# Patient Record
Sex: Male | Born: 1995 | Race: White | Hispanic: No | Marital: Single | Smoking: Current every day smoker
Health system: Southern US, Community
[De-identification: ages and names within clinical notes are randomized; demographics above are authoritative.]

## PROBLEM LIST (undated history)

## (undated) DIAGNOSIS — J189 Pneumonia, unspecified organism: Secondary | ICD-10-CM

---
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
## 2015-07-06 ENCOUNTER — Encounter (HOSPITAL_COMMUNITY): Payer: Self-pay | Admitting: Emergency Medicine

## 2015-07-06 ENCOUNTER — Emergency Department (HOSPITAL_COMMUNITY)
Admission: EM | Admit: 2015-07-06 | Discharge: 2015-07-06 | Disposition: A | Discharge status: Home / Self Care | Payer: Self-pay | Attending: Emergency Medicine | Admitting: Emergency Medicine

## 2015-07-06 DIAGNOSIS — N390 Urinary tract infection, site not specified: Secondary | ICD-10-CM | POA: Insufficient documentation

## 2015-07-06 DIAGNOSIS — F17210 Nicotine dependence, cigarettes, uncomplicated: Secondary | ICD-10-CM | POA: Insufficient documentation

## 2015-07-06 LAB — URINE MICROSCOPIC-ADD ON

## 2015-07-06 LAB — URINALYSIS, ROUTINE W REFLEX MICROSCOPIC
Bilirubin Urine: NEGATIVE
Glucose, UA: NEGATIVE mg/dL
KETONES UR: NEGATIVE mg/dL
NITRITE: POSITIVE — AB
PROTEIN: 100 mg/dL — AB
Specific Gravity, Urine: 1.025 (ref 1.005–1.030)
pH: 6 (ref 5.0–8.0)

## 2015-07-06 MED ORDER — CEPHALEXIN 500 MG PO CAPS: 500.0000 mg | ORAL_CAPSULE | Freq: Four times a day (QID) | ORAL | Status: DC

## 2015-07-06 MED ORDER — CEPHALEXIN 500 MG PO CAPS
500.0000 mg | ORAL_CAPSULE | Freq: Once | ORAL | Status: AC
  Administered 2015-07-06: 500 mg via ORAL
  Filled 2015-07-06: qty 1

## 2015-07-06 NOTE — Discharge Instructions (Signed)

## 2015-07-06 NOTE — ED Notes (Signed)
Pt reports burning with urination x 1 week with lower abd pain. Pt reports negative STD check about 1 mo ago with no new exposures since. Pt denies fevers.

## 2015-07-06 NOTE — ED Provider Notes (Signed)
CSN: [id number]     Arrival date & time 07/06/15  1539 History   First MD Initiated Contact with Patient 07/06/15 1611     Chief Complaint  Patient presents with  . Dysuria     (Consider location/radiation/quality/duration/timing/severity/associated sxs/prior Treatment) HPI   James Wang is a 19 y.o. male who presents to the Emergency Department complaining of burning with urination and lower abdominal pain for one week.  He states that he was diagnosed with a urinary tract infection 4-5 months ago and present symptoms feel similar. He states the symptoms have been becoming more persistent. He states that he only has one sexual partner currently and was checked for STD's one month ago.  Nothing makes the symptoms better or worse.  He denies fever, n/v, penile discharge, hematuria, testicle pain or swelling.    History reviewed. No pertinent past medical history. History reviewed. No pertinent past surgical history. No family history on file. Social History  Substance Use Topics  . Smoking status: Light Tobacco Smoker    Types: Cigarettes  . Smokeless tobacco: None  . Alcohol Use: No    Review of Systems  Constitutional: Negative for fever, chills, activity change and appetite change.  Respiratory: Negative for chest tightness and shortness of breath.   Gastrointestinal: Negative for nausea, vomiting and abdominal pain.  Genitourinary: Positive for dysuria, urgency and frequency. Negative for hematuria, flank pain, decreased urine volume, discharge, penile swelling, scrotal swelling, difficulty urinating, genital sores and testicular pain.  Musculoskeletal: Negative for back pain.  Skin: Negative for rash.  Neurological: Negative for dizziness, weakness and numbness.  Hematological: Negative for adenopathy.  Psychiatric/Behavioral: Negative for confusion.  All other systems reviewed and are negative.     Allergies  Review of patient's allergies indicates no known  allergies.  Home Medications   Prior to Admission medications   Not on File   Pulse 94  Temp(Src) 98.8 F (37.1 C) (Oral)  Resp 18  Ht 6\' 4"  (1.93 m)  Wt 64.864 kg  BMI 17.41 kg/m2  SpO2 100% Physical Exam  Constitutional: He is oriented to person, place, and time. He appears well-developed and well-nourished. No distress.  HENT:  Head: Atraumatic.  Mouth/Throat: Oropharynx is clear and moist.  Cardiovascular: Normal rate, regular rhythm and intact distal pulses.   No murmur heard. Pulmonary/Chest: Effort normal and breath sounds normal. No respiratory distress. He has no wheezes. He has no rales.  Abdominal: Soft. Normal appearance. He exhibits no distension and no mass. There is no hepatosplenomegaly. There is tenderness in the suprapubic area. There is no rigidity, no rebound, no guarding, no CVA tenderness and no tenderness at McBurney's point. Hernia confirmed negative in the right inguinal area and confirmed negative in the left inguinal area.  Mild ttp of the suprapubic region.  Remaining abdomen is soft, non-tender without guarding or rebound tenderness. No CVA tenderness  Genitourinary: Testes normal and penis normal. Cremasteric reflex is present. Right testis shows no mass and no tenderness. Right testis is descended. Left testis shows no mass and no tenderness. Left testis is descended. Circumcised. No penile tenderness. No discharge found.  Exam was chaperoned by nursing staff  Musculoskeletal: Normal range of motion. He exhibits no edema.  Neurological: He is alert and oriented to person, place, and time. Coordination normal.  Skin: Skin is warm and dry. No rash noted.  Nursing note and vitals reviewed.   ED Course  Procedures (including critical care time) Labs Review Labs Reviewed  URINALYSIS, ROUTINE  W REFLEX MICROSCOPIC (NOT AT [facility]) - Abnormal; Notable for the following:    APPearance HAZY (*)    Hgb urine dipstick LARGE (*)    Protein, ur 100 (*)     Nitrite POSITIVE (*)    Leukocytes, UA MODERATE (*)    All other components within normal limits  URINE MICROSCOPIC-ADD ON - Abnormal; Notable for the following:    Squamous Epithelial / LPF 0-5 (*)    Bacteria, UA MANY (*)    All other components within normal limits  URINE CULTURE    I have personally reviewed and evaluated these images and lab results as part of my medical decision-making.  Urine culture, Gc and chlamydia pending  MDM   Final diagnoses:  Acute urinary tract infection   Pt is well appearing.  Non-toxic.  Vitals stable.  Will treat for infection and advised pt to arrange urology f/u given that this appears to be a recurrent issue.  He verb understanding and agrees to plan.  Stable for d/c     Pauline Aus, PA-C 07/08/15 2319  Donnetta Hutching, MD 07/10/15 1336

## 2015-07-07 LAB — GC/CHLAMYDIA PROBE AMP (~~LOC~~) NOT AT ARMC
CHLAMYDIA, DNA PROBE: NEGATIVE
NEISSERIA GONORRHEA: NEGATIVE

## 2015-07-08 LAB — URINE CULTURE: Culture: >=100000

## 2015-07-09 ENCOUNTER — Telehealth (HOSPITAL_BASED_OUTPATIENT_CLINIC_OR_DEPARTMENT_OTHER): Payer: Self-pay | Admitting: Emergency Medicine

## 2015-07-09 NOTE — Telephone Encounter (Signed)
Post ED Visit - Positive Culture Follow-up  Culture report reviewed by antimicrobial stewardship pharmacist:  []  Enzo BiNathan Batchelder, Pharm.D. []  Celedonio MiyamotoJeremy Frens, Pharm.D., BCPS [x]  Garvin FilaMike Maccia, Pharm.D. []  Georgina PillionElizabeth Martin, Pharm.D., BCPS []  CoburgMinh Pham, [address]Pharm.D., BCPS, AAHIVP []  Estella HuskMichelle Turner, Pharm.D., BCPS, AAHIVP []  Tennis Mustassie Stewart, Pharm.D. []  Sherle Poeob Vincent, [address]Pharm[address]D.  Positive urine culture E. coli Treated with cephalexin, organism sensitive to the same and no further patient follow-up is required at this time.  Berle MullMiller, Mang [place]elrigg 07/09/2015, 9:27 AM

## 2015-10-15 ENCOUNTER — Emergency Department (HOSPITAL_COMMUNITY): Payer: Self-pay

## 2015-10-15 ENCOUNTER — Other Ambulatory Visit: Payer: Self-pay

## 2015-10-15 ENCOUNTER — Emergency Department (HOSPITAL_COMMUNITY)
Admission: EM | Admit: 2015-10-15 | Discharge: 2015-10-15 | Disposition: A | Discharge status: Home / Self Care | Payer: Self-pay | Attending: Emergency Medicine | Admitting: Emergency Medicine

## 2015-10-15 ENCOUNTER — Encounter (HOSPITAL_COMMUNITY): Payer: Self-pay | Admitting: Family Medicine

## 2015-10-15 DIAGNOSIS — R42 Dizziness and giddiness: Secondary | ICD-10-CM | POA: Insufficient documentation

## 2015-10-15 DIAGNOSIS — J189 Pneumonia, unspecified organism: Secondary | ICD-10-CM

## 2015-10-15 DIAGNOSIS — D72829 Elevated white blood cell count, unspecified: Secondary | ICD-10-CM | POA: Insufficient documentation

## 2015-10-15 DIAGNOSIS — J159 Unspecified bacterial pneumonia: Secondary | ICD-10-CM | POA: Insufficient documentation

## 2015-10-15 DIAGNOSIS — R55 Syncope and collapse: Secondary | ICD-10-CM | POA: Insufficient documentation

## 2015-10-15 DIAGNOSIS — Z87891 Personal history of nicotine dependence: Secondary | ICD-10-CM | POA: Insufficient documentation

## 2015-10-15 LAB — BASIC METABOLIC PANEL
Anion gap: 12 (ref 5–15)
BUN: 8 mg/dL (ref 6–20)
CALCIUM: 9.3 mg/dL (ref 8.9–10.3)
CO2: 25 mmol/L (ref 22–32)
CREATININE: 0.96 mg/dL (ref 0.61–1.24)
Chloride: 102 mmol/L (ref 101–111)
GFR calc Af Amer: >60 mL/min (ref >60)
Glucose, Bld: 114 mg/dL — ABNORMAL HIGH (ref 65–99)
Potassium: 4.2 mmol/L (ref 3.5–5.1)
SODIUM: 139 mmol/L (ref 135–145)

## 2015-10-15 LAB — CBC
HCT: 39.8 % (ref 39.0–52.0)
Hemoglobin: 13.5 g/dL (ref 13.0–17.0)
MCH: 30.5 pg (ref 26.0–34.0)
MCHC: 33.9 g/dL (ref 30.0–36.0)
MCV: 90 fL (ref 78.0–100.0)
PLATELETS: 297 10*3/uL (ref 150–400)
RBC: 4.42 MIL/uL (ref 4.22–5.81)
RDW: 11.8 % (ref 11.5–15.5)
WBC: 17.1 10*3/uL — AB (ref 4.0–10.5)

## 2015-10-15 LAB — I-STAT TROPONIN, ED: TROPONIN I, POC: 0 ng/mL (ref 0.00–0.08)

## 2015-10-15 MED ORDER — IOHEXOL 350 MG/ML SOLN
80.0000 mL | Freq: Once | INTRAVENOUS | Status: AC | PRN
  Administered 2015-10-15: 80 mL via INTRAVENOUS

## 2015-10-15 MED ORDER — DOXYCYCLINE HYCLATE 100 MG PO CAPS
100.0000 mg | ORAL_CAPSULE | Freq: Two times a day (BID) | ORAL | Status: DC
Start: 2015-10-15 — End: 2015-12-29

## 2015-10-15 NOTE — ED Notes (Addendum)
Pt here for high fever, cough, SOB. sts he got in the shower and passed out. sts fever was 104 and given 2 ibuprofen 2 hours ago,.

## 2015-10-15 NOTE — ED Provider Notes (Signed)
CSN: [id number]     Arrival date & time 10/15/15  1841 History  By signing my name below, I, Freida Busmaniana Omoyeni, attest that this documentation has been prepared under the direction and in the presence of non-physician practitioner, Joycie PeekBenjamin Coline Calkin, PA-C. Electronically Signed: Freida Busmaniana Omoyeni, Scribe. 10/15/2015. 9:27 PM.    Chief Complaint  Patient presents with  . Cough  . Shortness of Breath    The history is provided by the patient. No language interpreter was used.     HPI Comments:  James Wang is a 20 y.o. male who presents to the Emergency Department complaining of dry cough with associated chest tightness x 1 week. He reports fever today with TMAX of 104.5. Pt also notes he became dizzy earlier today while in the shower and had unwitnessed syncopal episode. Pt states he lost consciousness for ~ 1 min. No head trauma. Denies any numbness or weakness, vision changes. He is at baseline per mom in the room. No alleviating factors noted. No recent antibiotic use or hospitalizations.  History reviewed. No pertinent past medical history. History reviewed. No pertinent past surgical history. History reviewed. No pertinent family history. Social History  Substance Use Topics  . Smoking status: Former Smoker    Types: Cigarettes    Quit date: 09/17/2015  . Smokeless tobacco: None  . Alcohol Use: No    Review of Systems  10 systems reviewed and all are negative for acute change except as noted in the HPI.  Allergies  Review of patient's allergies indicates no known allergies.  Home Medications   Prior to Admission medications   Medication Sig Start Date End Date Taking? Authorizing Provider  ibuprofen (ADVIL,MOTRIN) 200 MG tablet Take 400 mg by mouth every 6 (six) hours as needed for moderate pain.   Yes Historical Provider, MD  doxycycline (VIBRAMYCIN) 100 MG capsule Take 1 capsule (100 mg total) by mouth 2 (two) times daily. One po bid x 7 days 10/15/15   Joycie PeekBenjamin Janmichael Giraud, PA-C    BP 109/71 mmHg  Pulse 95  Temp(Src) 98.9 F (37.2 C)  Resp 18  SpO2 99% Physical Exam  Constitutional: He is oriented to person, place, and time. He appears well-developed and well-nourished. No distress.  HENT:  Head: Normocephalic and atraumatic.  Eyes: Conjunctivae are normal.  Cardiovascular: Normal rate, regular rhythm and normal heart sounds.   Pulmonary/Chest: Effort normal. No respiratory distress.  Rhonchi right lower base, mild coarse sounds bilateral lower fields.  Abdominal: He exhibits no distension.  Neurological: He is alert and oriented to person, place, and time.  Skin: Skin is warm and dry.  Psychiatric: He has a normal mood and affect.  Nursing note and vitals reviewed.   ED Course  Procedures   DIAGNOSTIC STUDIES:  Oxygen Saturation is 99% on RA, normal by my interpretation.    COORDINATION OF CARE:  7:54 PM Pt updated with XR results. Discussed treatment plan with pt at bedside and pt agreed to plan.  Labs Review Labs Reviewed  BASIC METABOLIC PANEL - Abnormal; Notable for the following:    Glucose, Bld 114 (*)    All other components within normal limits  CBC - Abnormal; Notable for the following:    WBC 17.1 (*)    All other components within normal limits  I-STAT TROPOININ, ED    Imaging Review Dg Chest 2 View  10/15/2015  CLINICAL DATA:  Fever, cough, shortness of breath for couple of days. Syncope today. EXAM: CHEST  2 VIEW COMPARISON:  Chest x-ray dated 03/13/2005. FINDINGS: Cardiomediastinal silhouette is normal in size and configuration. Vague opacity is seen within the left lower lobe posteriorly, compatible with either atelectasis or pneumonia. Lungs otherwise clear. No pleural effusion. Osseous and soft tissue structures about the chest are unremarkable. IMPRESSION: Vague opacity within the left lower lobe posteriorly, compatible with either atelectasis or pneumonia. Favor early pneumonia. Recommend follow-up chest x-ray to ensure  resolution. Electronically Signed   By: Bary Richard M.D.   On: 10/15/2015 19:11   Ct Angio Chest Pe W/cm &/or Wo Cm  10/15/2015  CLINICAL DATA:  Fever, cough, shortness of breath, syncopal episode today. EXAM: CT ANGIOGRAPHY CHEST WITH CONTRAST TECHNIQUE: Multidetector CT imaging of the chest was performed using the standard protocol during bolus administration of intravenous contrast. Multiplanar CT image reconstructions and MIPs were obtained to evaluate the vascular anatomy. CONTRAST:  80mL OMNIPAQUE IOHEXOL 350 MG/ML SOLN COMPARISON:  Chest x-ray performed earlier today P FINDINGS: Mediastinum/Lymph Nodes: No pulmonary emboli identified within the main, lobar or segmental pulmonary arteries. No thoracic aortic aneurysm or dissection. Heart size is normal. No pericardial effusion. Mildly prominent lymph nodes within the bilateral perihilar regions are likely reactive in nature. Lungs/Pleura: Dense consolidations at each lung base which are most suggestive of pneumonia. Additional smaller nodular consolidations within the superior segment of the right lower lobe and lateral aspects of the right lower lobe, likely pneumonia as well. No pleural effusion. Upper abdomen: Limited images of the upper abdomen are unremarkable. Musculoskeletal: No chest wall mass or suspicious bone lesions identified. Review of the MIP images confirms the above findings. IMPRESSION: 1. Dense bibasilar consolidations, almost certainly representing multifocal pneumonia, largest focus within the left lower lobe measuring approximately 5 x 3 cm. 2. Remainder of the exam is unremarkable. Mildly prominent lymph nodes within the bilateral perihilar regions are likely reactive in nature. No pulmonary embolism seen. Heart size is normal. Electronically Signed   By: Bary Richard M.D.   On: 10/15/2015 22:15   I have personally reviewed and evaluated these images and lab results as part of my medical decision-making.  Filed Vitals:    10/15/15 1853  BP: 109/71  Pulse: 95  Temp: 98.9 F (37.2 C)  Resp: 18  SpO2: 99%     MDM   Patient presents with symptoms consistent with pneumonia. Fever, cough, leukocytosis of 17.1. He does report an episode of syncope lasting 1 minute while in the shower earlier this evening. Opacity in left lower lobe. Pt is not ill appearing, immunocompromised, and does not have multiple comorbidities. Due to infiltrate on x-ray with associated syncope, there is some concern for pulmonary embolus. Plan to obtain CT PE.  CT shows dense bibasilar consolidations, almost certainly representing multifocal pneumonia. Will be treated for community-acquired pneumonia Pt appears appropriate for outpatient treatment with abx therapy and follow-up with PCP in the next 1-2 weeks for repeat chest x-ray to ensure resolution.. Pt has been advised to return to the ED if symptoms worsen, they become SOB or they do not improve. Pt verbalizes understanding and is agreeable with plan.  Prior to patient discharge, I discussed and reviewed this case with Dr. Clayborne Dana  Final diagnoses:  Community acquired pneumonia    I personally performed the services described in this documentation, which was scribed in my presence. The recorded information has been reviewed and is accurate.   Joycie Peek, PA-C 10/15/15 2242  Marily Memos, MD 10/15/15 [phone]

## 2015-10-15 NOTE — Discharge Instructions (Signed)
Your symptoms are likely due to pneumonia. He'll be treated for this with antibiotics. The CT scan of your chest did not show any blood clots. Take your antibiotics as prescribed, take all of your antibiotics and do not say [place] them. Follow-up with your pediatrician next week for reevaluation. He will need a repeat chest x-ray in the next 2 weeks to ensure resolution of your symptoms. Return to ED for any new or worsening symptoms as we discussed.  Community-Acquired Pneumonia, Adult Pneumonia is an infection of the lungs. There are different types of pneumonia. One type can develop while a person is in a hospital. A different type, called community-acquired pneumonia, develops in people who are not, or have not recently been, in the hospital or other health care facility.  CAUSES Pneumonia may be caused by bacteria, viruses, or funguses. Community-acquired pneumonia is often caused by Streptococcus pneumonia bacteria. These bacteria are often passed from one person to another by breathing in droplets from the cough or sneeze of an infected person. RISK FACTORS The condition is more likely to develop in:  People who havechronic diseases, such as chronic obstructive pulmonary disease (COPD), asthma, congestive heart failure, cystic fibrosis, diabetes, or kidney disease.  People who haveearly-stage or late-stage HIV.  People who havesickle cell disease.  People who havehad their spleen removed (splenectomy).  People who havepoor Administrator.  People who havemedical conditions that increase the risk of breathing in (aspirating) secretions their own mouth and nose.   People who havea weakened immune system (immunocompromised).  People who smoke.  People whotravel to areas where pneumonia-causing germs commonly exist.  People whoare around animal habitats or animals that have pneumonia-causing germs, including birds, bats, rabbits, cats, and farm animals. SYMPTOMS Symptoms  of this condition include:  Adry cough.  A wet (productive) cough.  Fever.  Sweating.  Chest pain, especially when breathing deeply or coughing.  Rapid breathing or difficulty breathing.  Shortness of breath.  Shaking chills.  Fatigue.  Muscle aches. DIAGNOSIS Your health care provider will take a medical history and perform a physical exam. You may also have other tests, including:  Imaging studies of your chest, including X-rays.  Tests to check your blood oxygen level and other blood gases.  Other tests on blood, mucus (sputum), fluid around your lungs (pleural fluid), and urine. If your pneumonia is severe, other tests may be done to identify the specific cause of your illness. TREATMENT The type of treatment that you receive depends on many factors, such as the cause of your pneumonia, the medicines you take, and other medical conditions that you have. For most adults, treatment and recovery from pneumonia may occur at home. In some cases, treatment must happen in a hospital. Treatment may include:  Antibiotic medicines, if the pneumonia was caused by bacteria.  Antiviral medicines, if the pneumonia was caused by a virus.  Medicines that are given by mouth or through an IV tube.  Oxygen.  Respiratory therapy. Although rare, treating severe pneumonia may include:  Mechanical ventilation. This is done if you are not breathing well on your own and you cannot maintain a safe blood oxygen level.  Thoracentesis. This procedureremoves fluid around one lung or both lungs to help you breathe better. HOME CARE INSTRUCTIONS  Take over-the-counter and prescription medicines only as told by your health care provider.  Only takecough medicine if you are losing sleep. Understand that cough medicine can prevent your body's natural ability to remove mucus from your lungs.  If you were prescribed an antibiotic medicine, take it as told by your health care provider. Do not  stop taking the antibiotic even if you start to feel better.  Sleep in a semi-upright position at night. Try sleeping in a reclining chair, or place a few pillows under your head.  Do not use tobacco products, including cigarettes, chewing tobacco, and e-cigarettes. If you need help quitting, ask your health care provider.  Drink enough water to keep your urine clear or pale yellow. This will help to thin out mucus secretions in your lungs. PREVENTION There are ways that you can decrease your risk of developing community-acquired pneumonia. Consider getting a pneumococcal vaccine if:  You are older than 20 years of age.  You are older than 20 years of age and are undergoing cancer treatment, have chronic lung disease, or have other medical conditions that affect your immune system. Ask your health care provider if this applies to you. There are different types and schedules of pneumococcal vaccines. Ask your health care provider which vaccination option is best for you. You may also prevent community-acquired pneumonia if you take these actions:  Get an influenza vaccine every year. Ask your health care provider which type of influenza vaccine is best for you.  Go to the dentist on a regular basis.  Wash your hands often. Use hand sanitizer if soap and water are not available. SEEK MEDICAL CARE IF:  You have a fever.  You are losing sleep because you cannot control your cough with cough medicine. SEEK IMMEDIATE MEDICAL CARE IF:  You have worsening shortness of breath.  You have increased chest pain.  Your sickness becomes worse, especially if you are an older adult or have a weakened immune system.  You cough up blood.   This information is not intended to replace advice given to you by your health care provider. Make sure you discuss any questions you have with your health care provider.   Document Released: 07/19/2005 Document Revised: 04/09/2015 Document Reviewed:  11/13/2014 Elsevier Interactive Patient Education [employer]2016 [employer]

## 2015-12-28 ENCOUNTER — Encounter (HOSPITAL_COMMUNITY): Payer: Self-pay | Admitting: *Deleted

## 2015-12-28 ENCOUNTER — Emergency Department (HOSPITAL_COMMUNITY)
Admission: EM | Admit: 2015-12-28 | Discharge: 2015-12-28 | Disposition: A | Discharge status: Home / Self Care | Payer: Self-pay | Attending: Emergency Medicine | Admitting: Emergency Medicine

## 2015-12-28 DIAGNOSIS — Z87891 Personal history of nicotine dependence: Secondary | ICD-10-CM | POA: Insufficient documentation

## 2015-12-28 DIAGNOSIS — H169 Unspecified keratitis: Secondary | ICD-10-CM | POA: Insufficient documentation

## 2015-12-28 MED ORDER — TOBRAMYCIN 0.3 % OP SOLN
1.0000 [drp] | Freq: Once | OPHTHALMIC | Status: AC
  Administered 2015-12-28: 1 [drp] via OPHTHALMIC
  Filled 2015-12-28: qty 5

## 2015-12-28 MED ORDER — KETOROLAC TROMETHAMINE 0.5 % OP SOLN
1.0000 [drp] | Freq: Once | OPHTHALMIC | Status: AC
  Administered 2015-12-28: 1 [drp] via OPHTHALMIC
  Filled 2015-12-28: qty 5

## 2015-12-28 MED ORDER — TETRACAINE HCL 0.5 % OP SOLN
OPHTHALMIC | Status: AC
  Filled 2015-12-28: qty 4

## 2015-12-28 NOTE — Discharge Instructions (Signed)
How to Use Eye Drops and Eye Ointments  HOW TO APPLY EYE DROPS  Follow these steps when applying eye drops:  1. Wash your hands.  2. Tilt your head back.  3. Put a finger under your eye and use it to gently pull your lower lid downward. Keep that finger in place.  4. Using your other hand, hold the dropper between your thumb and index finger.  5. Position the dropper just over the edge of the lower lid. Hold it as close to your eye as you can without touching the dropper to your eye.  6. Steady your hand. One way to do this is to lean your index finger against your brow.  7. Look up.  8. Slowly and gently squeeze one drop of medicine into your eye.  9. Close your eye.  10. Place a finger between your lower eyelid and your nose. Press gently for 2 minutes. This increases the amount of time that the medicine is exposed to the eye. It also reduces side effects that can develop if the drop gets into the bloodstream through the nose.  HOW TO APPLY EYE OINTMENTS  Follow these steps when applying eye ointments:  1. Wash your hands.  2. Put a finger under your eye and use it to gently pull your lower lid downward. Keep that finger in place.  3. Using your other hand, place the tip of the tube between your thumb and index finger with the remaining fingers braced against your cheek or nose.  4. Hold the tube just over the edge of your lower lid without touching the tube to your lid or eyeball.  5. Look up.  6. Line the inner part of your lower lid with ointment.  7. Gently pull up on your upper lid and look down. This will force the ointment to spread over the surface of the eye.  8. Release the upper lid.  9. If you can, close your eyes for 1-2 minutes.  Do not rub your eyes. If you applied the ointment correctly, your vision will be blurry for a few minutes. This is normal.  ADDITIONAL INFORMATION   Make sure to use the eye drops or ointment as told by your health care provider.   If you have been told to use both eye  drops and an eye ointment, apply the eye drops first, then wait 3-4 minutes before you apply the ointment.   Try not to touch the tip of the dropper or tube to your eye. A dropper or tube that has touched the eye can become contaminated.     This information is not intended to replace advice given to you by your health care provider. Make sure you discuss any questions you have with your health care provider.     Document Released: 10/25/2000 Document Revised: 12/03/2014 Document Reviewed: 07/15/2014  [employer]vier Interactive Patient Education 2016 [employer]

## 2015-12-28 NOTE — ED Notes (Signed)
Pt states that he fell asleep with his contacts in, c/o eye irritation, sensitive to light that started this am,

## 2015-12-29 ENCOUNTER — Encounter (HOSPITAL_COMMUNITY): Payer: Self-pay | Admitting: Emergency Medicine

## 2015-12-29 ENCOUNTER — Emergency Department (HOSPITAL_COMMUNITY)
Admission: EM | Admit: 2015-12-29 | Discharge: 2015-12-29 | Disposition: A | Discharge status: Home / Self Care | Payer: Self-pay | Attending: Emergency Medicine | Admitting: Emergency Medicine

## 2015-12-29 DIAGNOSIS — H18821 Corneal disorder due to contact lens, right eye: Secondary | ICD-10-CM

## 2015-12-29 DIAGNOSIS — W19XXXA Unspecified fall, initial encounter: Secondary | ICD-10-CM | POA: Insufficient documentation

## 2015-12-29 DIAGNOSIS — Z87891 Personal history of nicotine dependence: Secondary | ICD-10-CM | POA: Insufficient documentation

## 2015-12-29 DIAGNOSIS — S0501XA Injury of conjunctiva and corneal abrasion without foreign body, right eye, initial encounter: Secondary | ICD-10-CM | POA: Insufficient documentation

## 2015-12-29 DIAGNOSIS — Z791 Long term (current) use of non-steroidal anti-inflammatories (NSAID): Secondary | ICD-10-CM | POA: Insufficient documentation

## 2015-12-29 DIAGNOSIS — Y929 Unspecified place or not applicable: Secondary | ICD-10-CM | POA: Insufficient documentation

## 2015-12-29 DIAGNOSIS — Z79899 Other long term (current) drug therapy: Secondary | ICD-10-CM | POA: Insufficient documentation

## 2015-12-29 DIAGNOSIS — Y939 Activity, unspecified: Secondary | ICD-10-CM | POA: Insufficient documentation

## 2015-12-29 DIAGNOSIS — H168 Other keratitis: Secondary | ICD-10-CM | POA: Insufficient documentation

## 2015-12-29 DIAGNOSIS — Y999 Unspecified external cause status: Secondary | ICD-10-CM | POA: Insufficient documentation

## 2015-12-29 DIAGNOSIS — H18829 Corneal disorder due to contact lens, unspecified eye: Secondary | ICD-10-CM

## 2015-12-29 MED ORDER — GATIFLOXACIN 0.5 % OP SOLN
1.0000 [drp] | Freq: Four times a day (QID) | OPHTHALMIC | Status: DC
  Administered 2015-12-29: 1 [drp] via OPHTHALMIC
  Filled 2015-12-29: qty 2.5

## 2015-12-29 MED ORDER — TETRACAINE HCL 0.5 % OP SOLN
2.0000 [drp] | Freq: Once | OPHTHALMIC | Status: AC
  Administered 2015-12-29: 2 [drp] via OPHTHALMIC
  Filled 2015-12-29: qty 4

## 2015-12-29 MED ORDER — HYDROCODONE-ACETAMINOPHEN 5-325 MG PO TABS: 1.0000 | ORAL_TABLET | ORAL | Status: DC | PRN

## 2015-12-29 MED ORDER — HOMATROPINE HBR 5 % OP SOLN
2.0000 [drp] | Freq: Once | OPHTHALMIC | Status: AC
  Administered 2015-12-29: 2 [drp] via OPHTHALMIC
  Filled 2015-12-29: qty 5

## 2015-12-29 MED ORDER — FLUORESCEIN SODIUM 1 MG OP STRP
1.0000 | ORAL_STRIP | Freq: Once | OPHTHALMIC | Status: AC
  Administered 2015-12-29: 1 via OPHTHALMIC
  Filled 2015-12-29: qty 1

## 2015-12-29 MED ORDER — HYDROCODONE-ACETAMINOPHEN 5-325 MG PO TABS
1.0000 | ORAL_TABLET | Freq: Once | ORAL | Status: AC
  Administered 2015-12-29: 1 via ORAL
  Filled 2015-12-29: qty 1

## 2015-12-29 NOTE — ED Provider Notes (Signed)
CSN: [id number]     Arrival date & time 12/29/15  [id number] History   First MD Initiated Contact with Patient 12/29/15 [id number]     Chief Complaint  Patient presents with  . Eye Problem     (Consider location/radiation/quality/duration/timing/severity/associated sxs/prior Treatment) The history is provided by the patient and a parent.   James Wang is a 20 y.o. male presenting for recheck of his eye pain for which he was seen here yesterday evening.  He fell asleep with his contacts in 2 days ago and woke yesterday morning with bilateral eye pain right greater than left along with photophobia and persistent bilateral clear tearing.  He has taken the contact lenses out.  These were a sample pair of contacts as he has a new prescription and he specified he wanted the sample to be extended wear, but suspects that that is not what he received.  He was seen here last night at which time he was diagnosed with bilateral keratitis.  He is using ketorolac and tobramycin eyedrops without improvement in pain, in fact  his pain is worse today.  He denies fevers, chills, or left eye drainage, nausea or vomiting.  He describes a dry sensation in his eyes, pain with attempts at opening his eyes and constant deep pain worsened by light exposure.  History reviewed. No pertinent past medical history. History reviewed. No pertinent past surgical history. History reviewed. No pertinent family history. Social History  Substance Use Topics  . Smoking status: Former Smoker    Types: Cigarettes    Quit date: 09/17/2015  . Smokeless tobacco: None  . Alcohol Use: No    Review of Systems  Constitutional: Negative for fever.  HENT: Negative for congestion and sore throat.   Eyes: Positive for photophobia, pain and redness. Negative for discharge and itching.  Respiratory: Negative for chest tightness and shortness of breath.   Cardiovascular: Negative for chest pain.  Gastrointestinal: Negative for nausea and abdominal  pain.  Genitourinary: Negative.   Musculoskeletal: Negative for joint swelling, arthralgias and neck pain.  Skin: Negative.  Negative for rash and wound.  Neurological: Negative for dizziness, weakness, light-headedness, numbness and headaches.  Psychiatric/Behavioral: Negative.       Allergies  Review of patient's allergies indicates no known allergies.  Home Medications   Prior to Admission medications   Medication Sig Start Date End Date Taking? Authorizing Provider  ibuprofen (ADVIL,MOTRIN) 200 MG tablet Take 400 mg by mouth every 6 (six) hours as needed for moderate pain.   Yes Historical Provider, MD  ketorolac (ACULAR) 0.5 % ophthalmic solution Place 1 drop into both eyes 4 (four) times daily.   Yes Historical Provider, MD  tobramycin (TOBREX) 0.3 % ophthalmic solution Place 1 drop into both eyes every 4 (four) hours.   Yes Historical Provider, MD  HYDROcodone-acetaminophen (NORCO/VICODIN) 5-325 MG tablet Take 1 tablet by mouth every 4 (four) hours as needed. 12/29/15   Burgess AmorJulie Danyela Posas, PA-C   BP 106/71 mmHg  Pulse 72  Temp(Src) 98.1 F (36.7 C) (Oral)  Resp 16  Ht 6\' 5"  (1.956 m)  Wt 69.4 kg  BMI 18.14 kg/m2  SpO2 100% Physical Exam  Constitutional: He is oriented to person, place, and time. He appears well-developed and well-nourished.  HENT:  Head: Normocephalic and atraumatic.  Right Ear: Tympanic membrane and ear canal normal.  Left Ear: Tympanic membrane and ear canal normal.  Nose: No mucosal edema or rhinorrhea.  Mouth/Throat: Uvula is midline, oropharynx is clear and moist  and mucous membranes are normal. No oropharyngeal exudate, posterior oropharyngeal edema, posterior oropharyngeal erythema or tonsillar abscesses.  Eyes: EOM are normal. Pupils are equal, round, and reactive to light. Right eye exhibits no chemosis and no discharge. Left eye exhibits no chemosis and no discharge. Right conjunctiva is injected. Left conjunctiva is injected.  Fundoscopic exam:       The right eye shows no exudate.       The left eye shows no exudate.  Slit lamp exam:      The right eye shows corneal abrasion and fluorescein uptake. The right eye shows no hyphema.       The left eye shows no corneal abrasion, no corneal ulcer, no hyphema and no fluorescein uptake.  Consensual pain right greater than left in that exposure to light in the left eye worsens deep pain in his right eye, but right eye exposure worsens pain in that eye but not the left.  Fluorescein uptake right eye in a comma like pattern from the 6:00 to the 9:00 position.  This is more consistent with an abrasion than an ulcer.  Cardiovascular: Normal rate and normal heart sounds.   Pulmonary/Chest: Effort normal. No respiratory distress. He has no wheezes. He has no rales.  Abdominal: Soft. There is no tenderness.  Musculoskeletal: Normal range of motion.  Neurological: He is alert and oriented to person, place, and time.  Skin: Skin is warm and dry. No rash noted.  Psychiatric: He has a normal mood and affect.    ED Course  Procedures (including critical care time) Labs Review Labs Reviewed - No data to display  Imaging Review No results found. I have personally reviewed and evaluated these images and lab results as part of my medical decision-making.   EKG Interpretation None      MDM   Final diagnoses:  Corneal abrasion due to contact lens, right  Keratitis secondary to contact lens    Discussed patient with Dr. Bing Plume who recommended homatropine for ciliary muscle spasm relief.  May continue ketorolac 4 times a day only.  If there is any concern for a corneal ulcer advises switch to gatifloxacin or moxifloxacin in place of his Tobrex.  If there is no ulcer may also add Pred Forte take for pain relief.  Exam today is more consistent with abrasion then an ulcer, although there may be an early ulcer starting, concern especially since an abrasion was not seen yesterday.  I will switch him to  gatifloxacin which was given to him and instructed to use in place of the Tobrex and to apply this drop every 30 minutes to his right eye, may use 4 times a day left eye since no uptake or concern for possible ulcer on the left.  Advised ketorolac 4 times a day only.  Homatropine was instilled in the right eye with still some irritating-like discomfort to his right eye, but resolution of the deep pain which helps confirm keratitis ciliary spasm.  He'll follow-up with Dr. Bing Plume tomorrow, he and mother understands to call in the morning for an appointment time.    Burgess Amor, PA-C 12/29/15 1755  Bethann Berkshire, MD 12/30/15 [phone]

## 2015-12-29 NOTE — ED Notes (Signed)
Pt states he was seen last night for eye pain.  States right eye very red and irritated.  Now cannot open right eye and both are very sensitive to light.

## 2015-12-29 NOTE — Discharge Instructions (Signed)
Switch to the new antibiotic given (gatifloxacin) in place of the tobrex and apply one drop to your right eye every 30 minutes today, apply 4 times daily to your left eye.  Continue to apply the ketorolac drop (only 4 drops daily maximum).  You may add another drop of the homatropine in the morning if the deep pain in your right eye returns (this will dilate your pupil and help reduce spasm). You may take the hydrocdone prescribed for pain relief.  This will make you drowsy - do not drive within 4 hours of taking this medication.

## 2015-12-31 NOTE — ED Provider Notes (Signed)
CSN: [id number]     Arrival date & time 12/28/15  2027 History   First MD Initiated Contact with Patient 12/28/15 2118     Chief Complaint  Patient presents with  . Eye Problem     (Consider location/radiation/quality/duration/timing/severity/associated sxs/prior Treatment) HPI   James Wang is a 20 y.o. male who presents to the Emergency Department complaining of bilateral eye pain and redness after wearing contacts.  He states that he purchased what he thought was extended wear contacts and slept overnight with the contacts in.  He has since removed them, but continues to have pain, redness and excessive tearing.  He describes sensitivity to light bilaterally.  He denies visual loss, headaches, dizziness and trauma   History reviewed. No pertinent past medical history. History reviewed. No pertinent past surgical history. No family history on file. Social History  Substance Use Topics  . Smoking status: Former Smoker    Types: Cigarettes    Quit date: 09/17/2015  . Smokeless tobacco: None  . Alcohol Use: No    Review of Systems  Constitutional: Negative for chills.  HENT: Negative for congestion, facial swelling and sore throat.   Eyes: Positive for photophobia, pain and redness. Negative for visual disturbance.  Respiratory: Negative for cough.   Neurological: Negative for dizziness, weakness and headaches.  Hematological: Negative for adenopathy.  All other systems reviewed and are negative.     Allergies  Review of patient's allergies indicates no known allergies.  Home Medications   Prior to Admission medications   Medication Sig Start Date End Date Taking? Authorizing Provider  HYDROcodone-acetaminophen (NORCO/VICODIN) 5-325 MG tablet Take 1 tablet by mouth every 4 (four) hours as needed. 12/29/15   Burgess AmorJulie Idol, PA-C  ibuprofen (ADVIL,MOTRIN) 200 MG tablet Take 400 mg by mouth every 6 (six) hours as needed for moderate pain.    Historical Provider, MD   ketorolac (ACULAR) 0.5 % ophthalmic solution Place 1 drop into both eyes 4 (four) times daily.    Historical Provider, MD  tobramycin (TOBREX) 0.3 % ophthalmic solution Place 1 drop into both eyes every 4 (four) hours.    Historical Provider, MD   BP 126/74 mmHg  Pulse 90  Temp(Src) 98.7 F (37.1 C) (Oral)  Resp 18  Ht 6\' 5"  (1.956 m)  Wt 69.4 kg  BMI 18.14 kg/m2  SpO2 100% Physical Exam  Constitutional: He is oriented to person, place, and time. He appears well-developed and well-nourished. No distress.  HENT:  Head: Normocephalic and atraumatic.  Mouth/Throat: Oropharynx is clear and moist.  Eyes: EOM are normal. Pupils are equal, round, and reactive to light. Lids are everted and swept, no foreign bodies found. Right eye exhibits no chemosis. Left eye exhibits no chemosis. Right conjunctiva is injected. Left conjunctiva is injected.  Fundoscopic exam:      The right eye shows no papilledema.       The left eye shows no papilledema.  Slit lamp exam:      The right eye shows no corneal abrasion, no corneal ulcer and no fluorescein uptake.       The left eye shows no corneal abrasion, no corneal ulcer and no fluorescein uptake.  Slit lamp exam reveals negative Seidel's sign, no corneal abrasion, dendrites or ulcer  Neck: Normal range of motion. Neck supple. No thyromegaly present.  Cardiovascular: Normal rate, regular rhythm, normal heart sounds and intact distal pulses.   No murmur heard. Pulmonary/Chest: Effort normal and breath sounds normal. No respiratory distress.  Musculoskeletal: Normal range of motion.  Lymphadenopathy:    He has no cervical adenopathy.  Neurological: He is alert and oriented to person, place, and time. He exhibits normal muscle tone. Coordination normal.  Skin: Skin is warm and dry. No rash noted.  Nursing note and vitals reviewed.   ED Course  Procedures (including critical care time) Labs Review Labs Reviewed - No data to display  Imaging  Review No results found. I have personally reviewed and evaluated these images and lab results as part of my medical decision-making.   EKG Interpretation None         Visual Acuity  Right Eye Distance: 20/25 Left Eye Distance: 20/20 Bilateral Distance:    Right Eye Near:   Left Eye Near:    Bilateral Near:     MDM   Final diagnoses:  Keratitis, bilateral   Pt well appearing.  Bilateral eye pain and redness after extended wear contact use.  No corneal ulcer, FB or abrasion on exam.  Ketorolac and tobramycin dispensed.  Pt agrees to d/c contact use for at least two weeks and close ophth f/u in 1-2 days.      Pauline Aus, PA-C 12/31/15 2244  Vanetta Mulders, MD 01/01/16 0157

## 2017-02-01 ENCOUNTER — Emergency Department (HOSPITAL_COMMUNITY): Payer: BLUE CROSS/BLUE SHIELD

## 2017-02-01 ENCOUNTER — Encounter (HOSPITAL_COMMUNITY): Payer: Self-pay | Admitting: Emergency Medicine

## 2017-02-01 ENCOUNTER — Emergency Department (HOSPITAL_COMMUNITY)
Admission: EM | Admit: 2017-02-01 | Discharge: 2017-02-01 | Disposition: A | Discharge status: Home / Self Care | Payer: BLUE CROSS/BLUE SHIELD | Attending: Emergency Medicine | Admitting: Emergency Medicine

## 2017-02-01 DIAGNOSIS — F17210 Nicotine dependence, cigarettes, uncomplicated: Secondary | ICD-10-CM | POA: Diagnosis not present

## 2017-02-01 DIAGNOSIS — R1011 Right upper quadrant pain: Secondary | ICD-10-CM | POA: Diagnosis not present

## 2017-02-01 LAB — COMPREHENSIVE METABOLIC PANEL
ALBUMIN: 4.7 g/dL (ref 3.5–5.0)
ALK PHOS: 82 U/L (ref 38–126)
ALT: 24 U/L (ref 17–63)
ANION GAP: 10 (ref 5–15)
AST: 38 U/L (ref 15–41)
BUN: 20 mg/dL (ref 6–20)
CALCIUM: 9.3 mg/dL (ref 8.9–10.3)
CO2: 26 mmol/L (ref 22–32)
Chloride: 101 mmol/L (ref 101–111)
Creatinine, Ser: 1.01 mg/dL (ref 0.61–1.24)
GFR calc Af Amer: >60 mL/min (ref >60)
GFR calc non Af Amer: >60 mL/min (ref >60)
GLUCOSE: 95 mg/dL (ref 65–99)
Potassium: 3.3 mmol/L — ABNORMAL LOW (ref 3.5–5.1)
SODIUM: 137 mmol/L (ref 135–145)
Total Bilirubin: 2.2 mg/dL — ABNORMAL HIGH (ref 0.3–1.2)
Total Protein: 7.4 g/dL (ref 6.5–8.1)

## 2017-02-01 LAB — CBC WITH DIFFERENTIAL/PLATELET
BASOS ABS: 0 10*3/uL (ref 0.0–0.1)
BASOS PCT: 1 %
EOS ABS: 0.2 10*3/uL (ref 0.0–0.7)
Eosinophils Relative: 4 %
HEMATOCRIT: 41.9 % (ref 39.0–52.0)
HEMOGLOBIN: 15.2 g/dL (ref 13.0–17.0)
Lymphocytes Relative: 37 %
Lymphs Abs: 2.1 10*3/uL (ref 0.7–4.0)
MCH: 31.5 pg (ref 26.0–34.0)
MCHC: 36.3 g/dL — AB (ref 30.0–36.0)
MCV: 86.7 fL (ref 78.0–100.0)
Monocytes Absolute: 0.7 10*3/uL (ref 0.1–1.0)
Monocytes Relative: 13 %
NEUTROS ABS: 2.6 10*3/uL (ref 1.7–7.7)
NEUTROS PCT: 45 %
Platelets: 237 10*3/uL (ref 150–400)
RBC: 4.83 MIL/uL (ref 4.22–5.81)
RDW: 11.8 % (ref 11.5–15.5)
WBC: 5.7 10*3/uL (ref 4.0–10.5)

## 2017-02-01 LAB — LIPASE, BLOOD: Lipase: 29 U/L (ref 11–51)

## 2017-02-01 MED ORDER — ONDANSETRON 4 MG PO TBDP: 4.0000 mg | ORAL_TABLET | Freq: Three times a day (TID) | ORAL | 0 refills | Status: DC | PRN

## 2017-02-01 MED ORDER — ONDANSETRON HCL 4 MG/2ML IJ SOLN
4.0000 mg | Freq: Once | INTRAMUSCULAR | Status: AC
  Administered 2017-02-01: 4 mg via INTRAVENOUS
  Filled 2017-02-01: qty 2

## 2017-02-01 MED ORDER — SODIUM CHLORIDE 0.9 % IV BOLUS (SEPSIS)
1000.0000 mL | Freq: Once | INTRAVENOUS | Status: AC
  Administered 2017-02-01: 1000 mL via INTRAVENOUS

## 2017-02-01 MED ORDER — IOPAMIDOL (ISOVUE-370) INJECTION 76%
100.0000 mL | Freq: Once | INTRAVENOUS | Status: AC | PRN
  Administered 2017-02-01: 100 mL via INTRAVENOUS

## 2017-02-01 MED ORDER — MELOXICAM 7.5 MG PO TABS
15.0000 mg | ORAL_TABLET | Freq: Every day | ORAL | 0 refills | Status: DC
Start: 2017-02-01 — End: 2017-09-23

## 2017-02-01 MED ORDER — MORPHINE SULFATE (PF) 4 MG/ML IV SOLN
4.0000 mg | Freq: Once | INTRAVENOUS | Status: AC
  Administered 2017-02-01: 4 mg via INTRAVENOUS
  Filled 2017-02-01: qty 1

## 2017-02-01 NOTE — ED Notes (Addendum)
Patient transported to CT 

## 2017-02-01 NOTE — Discharge Instructions (Signed)

## 2017-02-01 NOTE — ED Triage Notes (Signed)
Pt c/o right upper quadrant pain/right rib pain with n/d since Friday.

## 2017-02-01 NOTE — ED Provider Notes (Signed)
[facility]-EMERG[facility]Y DEPT Provider Note   CSN: [id number] Arrival date & time: 02/01/17  1956     History   Chief Complaint Chief Complaint  Patient presents with  . Abdominal Pain    HPI James Wang is a 21 y.o. male.  HPI  The patient is a 21 year old male, he is 6 foot 5, very skinny, works on a loading dock for a [employer], noticed proximally 2 weeks ago that he was developing some sharp and stabbing back pain, over the last 2 weeks this has progressed and become much worse, seems to be radiating towards the front of the right upper quadrant, right ribs in the mid abdomen. He has no history of any surgical problems of his abdomen, he does not take any daily medications, he does smoke cigarettes but denies marijuana. He reports that this pain is not necessarily worse with any movement, he does have some increase with breathing and with coughing. There is no fevers, no significant amount of coughing, no swelling of the legs, no recent travel trauma or immobilization and no recent surgery. He denies a history of cancer. The symptoms of become severe today propping his emergency department visit. There is no family history of Marfan syndrome though he does state that all of the males in his family are very tall.  Has had some diarrhea as well - watery.  Non bloody - sometimes after eating.  History reviewed. No pertinent past medical history.  There are no active problems to display for this patient.   History reviewed. No pertinent surgical history.     Home Medications    Prior to Admission medications   Medication Sig Start Date End Date Taking? Authorizing Provider  naproxen sodium (ANAPROX) 220 MG tablet Take 440 mg by mouth daily as needed.   Yes [provider]  meloxicam (MOBIC) 7.5 MG tablet Take 2 tablets (15 mg total) by mouth daily. 02/01/17   Eber Hong, MD  ondansetron (ZOFRAN ODT) 4 MG disintegrating tablet Take 1 tablet (4 mg total) by mouth every 8  (eight) hours as needed for nausea. 02/01/17   Eber Hong, MD    Family History No family history on file.  Social History Social History  Substance Use Topics  . Smoking status: Current Every Day Smoker    Types: Cigarettes    Last attempt to quit: 09/17/2015  . Smokeless tobacco: Never Used  . Alcohol use Yes     Allergies   Patient has no known allergies.   Review of Systems Review of Systems  All other systems reviewed and are negative.    Physical Exam Updated Vital Signs BP 129/70   Pulse (!) 58   Temp (!) 97.5 F (36.4 C) (Oral)   Resp 17   Ht 6\' 4"  (1.93 m)   Wt 79.4 kg (175 lb)   SpO2 98%   BMI 21.30 kg/m   Physical Exam  Constitutional: He appears well-developed. No distress.  Uncomfortable appearing  HENT:  Head: Normocephalic and atraumatic.  Mouth/Throat: Oropharynx is clear and moist. No oropharyngeal exudate.  Eyes: Conjunctivae and EOM are normal. Pupils are equal, round, and reactive to light. Right eye exhibits no discharge. Left eye exhibits no discharge. No scleral icterus.  Neck: Normal range of motion. Neck supple. No JVD present. No thyromegaly present.  Cardiovascular: Normal rate, regular rhythm, normal heart sounds and intact distal pulses.  Exam reveals no gallop and no friction rub.   No murmur heard. Pulses at the  radial arteries equal bialetrally  Pulmonary/Chest: Effort normal and breath sounds normal. No respiratory distress. He has no wheezes. He has no rales.  Pectus excavatum present  Abdominal: Soft. Bowel sounds are normal. He exhibits no distension and no mass. There is tenderness ( minimal epig and mid abd ttp, RUQ ttp presaent).  Musculoskeletal: Normal range of motion. He exhibits no edema or tenderness.  Lymphadenopathy:    He has no cervical adenopathy.  Neurological: He is alert. Coordination normal.  Skin: Skin is warm and dry. No rash noted. No erythema.  Psychiatric: He has a normal mood and affect. His behavior  is normal.  Nursing note and vitals reviewed.    ED Treatments / Results  Labs (all labs ordered are listed, but only abnormal results are displayed) Labs Reviewed  CBC WITH DIFFERENTIAL/PLATELET - Abnormal; Notable for the following:       Result Value   MCHC 36.3 (*)    All other components within normal limits  COMPREHENSIVE METABOLIC PANEL - Abnormal; Notable for the following:    Potassium 3.3 (*)    Total Bilirubin 2.2 (*)    All other components within normal limits  LIPASE, BLOOD     Radiology Dg Chest Port 1 View  Result Date: 02/01/2017 CLINICAL DATA:  Right upper quadrant and right rib pain with nausea and diarrhea since Friday. EXAM: PORTABLE CHEST 1 VIEW COMPARISON:  10/15/2015 CXR and chest CT. FINDINGS: The heart size and mediastinal contours are within normal limits. The lungs are hyperinflated without pneumonic consolidation, CHF or pneumothorax. No effusion. The visualized skeletal structures are unremarkable. IMPRESSION: Hyperinflated but clear lungs. Electronically Signed   By: Tollie Ethavid  Kwon M.D.   On: 02/01/2017 21:07   Ct Angio Chest/abd/pel For Dissection W And/or Wo Contrast  Result Date: 02/01/2017 CLINICAL DATA:  Right upper quadrant and right rib pain since Friday. EXAM: CT ANGIOGRAPHY CHEST, ABDOMEN AND PELVIS TECHNIQUE: Multidetector CT imaging through the chest, abdomen and pelvis was performed using the standard protocol during bolus administration of intravenous contrast. Multiplanar reconstructed images and MIPs were obtained and reviewed to evaluate the vascular anatomy. CONTRAST:  100 cc Isovue 370 IV COMPARISON:  None. FINDINGS: CTA CHEST FINDINGS Cardiovascular: Normal size cardiac chambers without pericardial effusion. Normal sized aorta without aneurysm or dissection. No acute pulmonary embolus. Normal branch pattern of the great vessels. Mediastinum/Nodes: No mediastinal or hilar adenopathy. Unremarkable trachea mainstem bronchi. Tiny subcentimeter  hypodensities of the thyroid gland without thyromegaly, the largest approximately 6 mm in the right lobe. Lungs/Pleura: Lungs are clear. No pleural effusion or pneumothorax. 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 No acute osseous abnormality. Review of the MIP images confirms the above findings. CTA ABDOMEN AND PELVIS FINDINGS VASCULAR Aorta: Normal caliber aorta without aneurysm, dissection, vasculitis or significant stenosis. Celiac: Patent without evidence of aneurysm, dissection, vasculitis or significant stenosis. SMA: Patent without evidence of aneurysm, dissection, vasculitis or significant stenosis. Renals: Both renal arteries are patent without evidence of aneurysm, dissection, vasculitis, fibromuscular dysplasia or significant stenosis. IMA: Patent without evidence of aneurysm, dissection, vasculitis or significant stenosis. Inflow: Patent without evidence of aneurysm, dissection, vasculitis or significant stenosis. Veins: No obvious venous abnormality within the limitations of this arterial phase study. Review of the MIP images confirms the above findings. NON-VASCULAR Hepatobiliary: No focal liver abnormality is seen. No gallstones, gallbladder wall thickening, or biliary dilatation. Pancreas: Unremarkable. No pancreatic ductal dilatation or surrounding inflammatory changes. Spleen: Normal in size without focal abnormality. Adrenals/Urinary Tract: Adrenal glands  are unremarkable. Kidneys are normal, without renal calculi, focal lesion, or hydronephrosis. Bladder is unremarkable. Stomach/Bowel: Stomach is within normal limits. Appendix appears normal. No evidence of bowel wall thickening, distention, or inflammatory changes. Lymphatic: No enlarged abdominal or pelvic lymph nodes. Reproductive: Prostate and seminal vesicles are unremarkable. Other: No abdominal wall hernia or abnormality. No abdominopelvic ascites. Musculoskeletal: No acute or significant osseous findings.  Review of the MIP images confirms the above findings. IMPRESSION: 1. No acute pulmonary embolus. 2. No aortic aneurysm or dissection. 3. Patent abdominal aorta and branch vessels without significant atherosclerosis or stenosis. 4. Unremarkable CT of the abdomen and pelvis. 5. Tiny subcentimeter hypodense nodules of the thyroid gland, the largest is approximately 6 mm. Given size and patient age, no further evaluation is suggested presently. 6. 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 Electronically Signed   By: Tollie Eth M.D.   On: 02/01/2017 22:08    Procedures Procedures (including critical care time)  Medications Ordered in ED Medications  morphine 4 MG/ML injection 4 mg (4 mg Intravenous Given 02/01/17 2107)  ondansetron (ZOFRAN) injection 4 mg (4 mg Intravenous Given 02/01/17 2107)  sodium chloride 0.9 % bolus 1,000 mL (0 mLs Intravenous Stopped 02/01/17 2228)  iopamidol (ISOVUE-370) 76 % injection 100 mL (100 mLs Intravenous Contrast Given 02/01/17 2144)     Initial Impression / Assessment and Plan / ED Course  I have reviewed the triage vital signs and the nursing notes.  Pertinent labs & imaging results that were available during my care of the patient were reviewed by me and considered in my medical decision making (see chart for details).  Concern for MArfan / dissection PE, PTX, Chole, Panc Pt agreeable to w/u - pain meds given.  No signs of dissection or pulmonary embolism, labs are also unremarkable, the patient was informed of all of his results and has close follow-up in the outpatient setting. He is agreeable to return should his symptoms worsen.  Final Clinical Impressions(s) / ED Diagnoses   Final diagnoses:  Right upper quadrant abdominal pain    New Prescriptions New Prescriptions   MELOXICAM (MOBIC) 7.5 MG TABLET    Take 2 tablets (15 mg total) by mouth daily.   ONDANSETRON (ZOFRAN ODT) 4 MG DISINTEGRATING TABLET    Take 1 tablet (4 mg total)  by mouth every 8 (eight) hours as needed for nausea.     Eber Hong, MD 02/01/17 [phone]

## 2017-07-27 ENCOUNTER — Emergency Department (HOSPITAL_COMMUNITY)
Admission: EM | Admit: 2017-07-27 | Discharge: 2017-07-27 | Disposition: A | Discharge status: Home / Self Care | Payer: BLUE CROSS/BLUE SHIELD | Attending: Emergency Medicine | Admitting: Emergency Medicine

## 2017-07-27 ENCOUNTER — Encounter (HOSPITAL_COMMUNITY): Payer: Self-pay

## 2017-07-27 DIAGNOSIS — Z79899 Other long term (current) drug therapy: Secondary | ICD-10-CM | POA: Diagnosis not present

## 2017-07-27 DIAGNOSIS — F17210 Nicotine dependence, cigarettes, uncomplicated: Secondary | ICD-10-CM | POA: Insufficient documentation

## 2017-07-27 DIAGNOSIS — K0889 Other specified disorders of teeth and supporting structures: Secondary | ICD-10-CM | POA: Diagnosis present

## 2017-07-27 MED ORDER — IBUPROFEN 800 MG PO TABS: 800.0000 mg | ORAL_TABLET | Freq: Three times a day (TID) | ORAL | 0 refills | Status: DC

## 2017-07-27 MED ORDER — AMOXICILLIN 500 MG PO CAPS: 500.0000 mg | ORAL_CAPSULE | Freq: Three times a day (TID) | ORAL | 0 refills | Status: DC

## 2017-07-27 NOTE — Discharge Instructions (Signed)
see your dentist on Wednesday at your appointment, amoxicillin 3 times a day to treat potential infection, ibuprofen as needed every 8 hours for pain, seek medical attention for severe or worsening pain or swelling

## 2017-07-27 NOTE — ED Notes (Signed)
Pt ambulatory to waiting room. Pt verbalized understanding of discharge instructions.   

## 2017-07-27 NOTE — ED Provider Notes (Signed)
[facility] Provider Note   CSN: [id number] Arrival date & time: 07/27/17  1826     History   Chief Complaint Chief Complaint  Patient presents with  . Dental Pain    HPI James Wang is a 21 y.o. male.  HPI  The patient notes a prior history of an injury to his lower tooth that required a root canal several years ago but since is done well.  Recently in the last couple of days has had increased amounts of pain in that lower area in the anterior jaw, he has not had any fevers but notes that he has mild swelling and tenderness with palpation.  He has taken no medication prior to arrival, the symptoms are persistent and gradually worsening  History reviewed. No pertinent past medical history.  There are no active problems to display for this patient.   History reviewed. No pertinent surgical history.     Home Medications    Prior to Admission medications   Medication Sig Start Date End Date Taking? Authorizing Provider  amoxicillin (AMOXIL) 500 MG capsule Take 1 capsule (500 mg total) by mouth 3 (three) times daily. 07/27/17   Eber HongMiller, Tyreona Panjwani, MD  ibuprofen (ADVIL,MOTRIN) 800 MG tablet Take 1 tablet (800 mg total) by mouth 3 (three) times daily. 07/27/17   Eber HongMiller, Leyna Vanderkolk, MD  meloxicam (MOBIC) 7.5 MG tablet Take 2 tablets (15 mg total) by mouth daily. 02/01/17   Eber HongMiller, Freya Zobrist, MD  naproxen sodium (ANAPROX) 220 MG tablet Take 440 mg by mouth daily as needed.    [provider]  ondansetron (ZOFRAN ODT) 4 MG disintegrating tablet Take 1 tablet (4 mg total) by mouth every 8 (eight) hours as needed for nausea. 02/01/17   Eber HongMiller, Helaman Mecca, MD    Family History History reviewed. No pertinent family history.  Social History Social History   Tobacco Use  . Smoking status: Current Every Day Smoker    Types: Cigarettes    Last attempt to quit: 09/17/2015    Years since quitting: 1.8  . Smokeless tobacco: Never Used  Substance Use Topics  . Alcohol  use: Yes  . Drug use: No     Allergies   Patient has no known allergies.   Review of Systems Review of Systems  Constitutional: Negative for fever.  HENT: Positive for dental problem and facial swelling.      Physical Exam Updated Vital Signs BP (!) 156/101 (BP Location: Right Arm)   Pulse 90   Temp 99.2 F (37.3 C) (Oral)   Resp 16   Ht 6\' 4"  (1.93 m)   Wt 79.4 kg (175 lb)   SpO2 100%   BMI 21.30 kg/m   Physical Exam  Constitutional: He appears well-developed and well-nourished. No distress.  HENT:  Head: Normocephalic and atraumatic.  Mouth/Throat: Oropharynx is clear and moist. No oropharyngeal exudate.  Dental Disease Lower anterior gingiva with tenderness, no obvious asymmetry or swelling of the jaw, no trismus or torticollis, no tenderness underneath the tongue, normal tongue protrusion, normal phonation.  No lymphadenopathy of the neck  Eyes: Conjunctivae are normal. No scleral icterus.  Neck: Normal range of motion. Neck supple. No thyromegaly present.  Cardiovascular: Normal rate and regular rhythm.  Pulmonary/Chest: Effort normal and breath sounds normal.  Lymphadenopathy:    He has no cervical adenopathy.  Neurological: He is alert.  Skin: Skin is warm and dry. No rash noted. He is not diaphoretic.  Nursing note and vitals reviewed.  ED Treatments / Results  Labs (all labs ordered are listed, but only abnormal results are displayed) Labs Reviewed - No data to display   Radiology No results found.  Procedures Procedures (including critical care time)  Medications Ordered in ED Medications - No data to display   Initial Impression / Assessment and Plan / ED Course  I have reviewed the triage vital signs and the nursing notes.  Pertinent labs & imaging results that were available during my care of the patient were reviewed by me and considered in my medical decision making (see chart for details).     Well-appearing, dental disease,  possible early infection, amoxicillin and ibuprofen.  He Artie has an appointment with a dentist on Wednesday.  He was informed of the instructions of the reasons for return and expressed his understanding  Final Clinical Impressions(s) / ED Diagnoses   Final diagnoses:  Pain, dental    ED Discharge Orders        Ordered    amoxicillin (AMOXIL) 500 MG capsule  3 times daily     07/27/17 1938    ibuprofen (ADVIL,MOTRIN) 800 MG tablet  3 times daily     07/27/17 1938       Eber HongMiller, Johney Perotti, MD 07/27/17 1940

## 2017-07-27 NOTE — ED Triage Notes (Signed)
I have a dental infection. Swelling to left lower jaw.  I have a dental appointment on next Wednesday.  Need an antibiotic per pt.

## 2017-07-29 ENCOUNTER — Emergency Department (HOSPITAL_COMMUNITY)
Admission: EM | Admit: 2017-07-29 | Discharge: 2017-07-29 | Disposition: A | Discharge status: Home / Self Care | Payer: BLUE CROSS/BLUE SHIELD

## 2017-07-29 NOTE — ED Notes (Signed)
Pt called twice from waiting room. No answer. Registration stated "he left"

## 2017-09-23 ENCOUNTER — Emergency Department (HOSPITAL_COMMUNITY): Payer: Self-pay

## 2017-09-23 ENCOUNTER — Other Ambulatory Visit: Payer: Self-pay

## 2017-09-23 ENCOUNTER — Encounter (HOSPITAL_COMMUNITY): Payer: Self-pay | Admitting: Emergency Medicine

## 2017-09-23 ENCOUNTER — Emergency Department (HOSPITAL_COMMUNITY)
Admission: EM | Admit: 2017-09-23 | Discharge: 2017-09-23 | Disposition: A | Discharge status: Home / Self Care | Payer: Self-pay | Attending: Emergency Medicine | Admitting: Emergency Medicine

## 2017-09-23 DIAGNOSIS — J40 Bronchitis, not specified as acute or chronic: Secondary | ICD-10-CM

## 2017-09-23 DIAGNOSIS — F17210 Nicotine dependence, cigarettes, uncomplicated: Secondary | ICD-10-CM | POA: Insufficient documentation

## 2017-09-23 HISTORY — DX: Pneumonia, unspecified organism: J18.9

## 2017-09-23 MED ORDER — AZITHROMYCIN 250 MG PO TABS: 250.0000 mg | ORAL_TABLET | Freq: Every day | ORAL | 0 refills | Status: DC

## 2017-09-23 MED ORDER — PSEUDOEPHEDRINE HCL 30 MG PO TABS: 30.0000 mg | ORAL_TABLET | Freq: Four times a day (QID) | ORAL | 0 refills | Status: DC | PRN

## 2017-09-23 MED ORDER — BENZONATATE 100 MG PO CAPS: 100.0000 mg | ORAL_CAPSULE | Freq: Three times a day (TID) | ORAL | 0 refills | Status: DC

## 2017-09-23 NOTE — ED Notes (Signed)
Pt declines repeat VS saying he must pick up his daughter

## 2017-09-23 NOTE — Discharge Instructions (Signed)
Take the medication as directed. Follow up with your doctor. Return here as needed. °

## 2017-09-23 NOTE — ED Provider Notes (Signed)
[facility] Provider Note   CSN: [id number] Arrival date & time: 09/23/17  1752     History   Chief Complaint Chief Complaint  Patient presents with  . Cough    HPI James Wang is a 22 y.o. male , every day smoker, who presents to the ED with c/o sinus pressure and congestion that started about a month ago and cough that started 2 weeks ago. Patient describes the cough as productive with thick dark sputum. Patient reports feeling like his sinuses are infected.    HPI  Past Medical History:  Diagnosis Date  . Pneumonia     There are no active problems to display for this patient.   History reviewed. No pertinent surgical history.     Home Medications    Prior to Admission medications   Medication Sig Start Date End Date Taking? Authorizing Provider  naproxen sodium (ANAPROX) 220 MG tablet Take 660 mg by mouth daily as needed (for pain).    Yes [provider]  azithromycin (ZITHROMAX) 250 MG tablet Take 1 tablet (250 mg total) by mouth daily. Take first 2 tablets together, then 1 every day until finished. 09/23/17   Janne NapoleonNeese, Adler Alton M, NP  benzonatate (TESSALON) 100 MG capsule Take 1 capsule (100 mg total) by mouth every 8 (eight) hours. 09/23/17   Janne NapoleonNeese, Daje Stark M, NP  pseudoephedrine (SUDAFED) 30 MG tablet Take 1 tablet (30 mg total) by mouth every 6 (six) hours as needed for congestion. 09/23/17   Janne NapoleonNeese, Ashaun Gaughan M, NP    Family History History reviewed. No pertinent family history.  Social History Social History   Tobacco Use  . Smoking status: Current Every Day Smoker    Packs/day: 0.25    Types: Cigarettes    Last attempt to quit: 09/17/2015    Years since quitting: 2.0  . Smokeless tobacco: Former Engineer, [employer]  Substance Use Topics  . Alcohol use: No    Frequency: Never  . Drug use: No     Allergies   Patient has no known allergies.   Review of Systems Review of Systems  Constitutional: Positive for chills. Negative for fever.    HENT: Positive for congestion, ear pain, rhinorrhea, sinus pressure, sinus pain and sore throat.   Eyes: Positive for itching. Negative for pain, discharge and redness.  Respiratory: Positive for cough. Negative for shortness of breath and wheezing.   Cardiovascular: Negative for chest pain.       Rib soreness with cough  Gastrointestinal: Negative for abdominal pain, diarrhea, nausea and vomiting.  Genitourinary: Negative for dysuria, frequency and urgency.  Musculoskeletal: Positive for myalgias.  Skin: Negative for rash.  Neurological: Positive for headaches.  Hematological: Positive for adenopathy.  Psychiatric/Behavioral: Negative for confusion.     Physical Exam Updated Vital Signs BP 138/87 (BP Location: Right Arm)   Pulse (!) 108   Temp 98.3 F (36.8 C) (Oral)   Resp 16   Ht 6\' 5"  (1.956 m)   Wt 79.4 kg (175 lb)   SpO2 100%   BMI 20.75 kg/m   Physical Exam  Constitutional: He appears well-developed and well-nourished. No distress.  HENT:  Head: Normocephalic and atraumatic.  Right Ear: Tympanic membrane normal.  Left Ear: Tympanic membrane normal.  Nose: Mucosal edema and rhinorrhea present. Right sinus exhibits maxillary sinus tenderness. Left sinus exhibits maxillary sinus tenderness.  Mouth/Throat: Uvula is midline and mucous membranes are normal. Posterior oropharyngeal erythema present. No oropharyngeal exudate, posterior oropharyngeal edema or tonsillar abscesses.  Eyes: Conjunctivae and EOM are normal. Pupils are equal, round, and reactive to light.  Neck: Neck supple.  Cardiovascular: Normal rate and regular rhythm.  Pulmonary/Chest: Effort normal and breath sounds normal. No respiratory distress.  Abdominal: Soft. There is no tenderness.  Musculoskeletal: Normal range of motion.  Neurological: He is alert.  Skin: Skin is warm and dry.  Nursing note and vitals reviewed.    ED Treatments / Results  Labs (all labs ordered are listed, but only abnormal  results are displayed) Labs Reviewed - No data to display  Radiology Dg Chest 2 View  Result Date: 09/23/2017 CLINICAL DATA:  Productive cough times 2-3 weeks. EXAM: CHEST  2 VIEW COMPARISON:  02/01/2017 FINDINGS: The heart size and mediastinal contours are within normal limits. The lungs are hyperinflated without pulmonary consolidation, edema, effusion or pneumothorax. The visualized skeletal structures are unremarkable. IMPRESSION: Hyperinflated lungs without acute pneumonic consolidation. Electronically Signed   By: Tollie Eth M.D.   On: 09/23/2017 20:03    Procedures Procedures (including critical care time)  Medications Ordered in ED Medications - No data to display   Initial Impression / Assessment and Plan / ED Course  I have reviewed the triage vital signs and the nursing notes. 22 y.o. male who is an every day smoker here with cough and congestion for 2 weeks. Pt CXR negative for acute infiltrate. Patients symptoms are consistent with bronchitis. Pt will be discharged with symptomatic treatment.  Verbalizes understanding and is agreeable with plan. Pt is hemodynamically stable & in NAD prior to dc.   Final Clinical Impressions(s) / ED Diagnoses   Final diagnoses:  Bronchitis    ED Discharge Orders        Ordered    pseudoephedrine (SUDAFED) 30 MG tablet  Every 6 hours PRN     09/23/17 2040    azithromycin (ZITHROMAX) 250 MG tablet  Daily     09/23/17 2040    benzonatate (TESSALON) 100 MG capsule  Every 8 hours     09/23/17 2040       Kerrie Buffalo [place], [place] 09/23/17 2043    Marily Memos, MD 09/24/17 0001

## 2017-09-23 NOTE — ED Notes (Signed)
Pt reports sinus pain x 1 month with cough for the last 2 weeks  Pt has no PCP and has not been seen by any provider in the last month  Clear voiced and in no distress

## 2017-09-23 NOTE — ED Triage Notes (Signed)
Sinus pressure and congestion x 1 month. Coughing x 2 weeks. Patient reports productive cough with thick dark sputum.

## 2018-06-12 ENCOUNTER — Emergency Department (HOSPITAL_COMMUNITY): Payer: Managed Care, Other (non HMO)

## 2018-06-12 ENCOUNTER — Emergency Department (HOSPITAL_COMMUNITY)
Admission: EM | Admit: 2018-06-12 | Discharge: 2018-06-12 | Disposition: A | Discharge status: Home / Self Care | Payer: Managed Care, Other (non HMO) | Attending: Emergency Medicine | Admitting: Emergency Medicine

## 2018-06-12 ENCOUNTER — Encounter (HOSPITAL_COMMUNITY): Payer: Self-pay | Admitting: Emergency Medicine

## 2018-06-12 ENCOUNTER — Other Ambulatory Visit: Payer: Self-pay

## 2018-06-12 DIAGNOSIS — G8929 Other chronic pain: Secondary | ICD-10-CM | POA: Insufficient documentation

## 2018-06-12 DIAGNOSIS — F17210 Nicotine dependence, cigarettes, uncomplicated: Secondary | ICD-10-CM | POA: Diagnosis not present

## 2018-06-12 DIAGNOSIS — M25512 Pain in left shoulder: Secondary | ICD-10-CM | POA: Diagnosis not present

## 2018-06-12 MED ORDER — DICLOFENAC SODIUM 75 MG PO TBEC: 75.0000 mg | DELAYED_RELEASE_TABLET | Freq: Two times a day (BID) | ORAL | 0 refills | Status: DC

## 2018-06-12 NOTE — ED Triage Notes (Signed)
Pt c/o L shoulder pain for over a month.

## 2018-06-12 NOTE — Discharge Instructions (Addendum)
Apply ice packs on/off to your shoulder.  Call one of the orthopedics providers listed to arrange a follow-up appt
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
## 2020-04-10 ENCOUNTER — Other Ambulatory Visit: Payer: Self-pay
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
## 2023-10-28 ENCOUNTER — Other Ambulatory Visit: Payer: Self-pay | Admitting: Occupational Medicine

## 2023-10-28 DIAGNOSIS — S46009A Unspecified injury of muscle(s) and tendon(s) of the rotator cuff of unspecified shoulder, initial encounter: Secondary | ICD-10-CM

## 2023-10-31 ENCOUNTER — Ambulatory Visit
Admission: RE | Admit: 2023-10-31 | Discharge: 2023-10-31 | Disposition: A | Discharge status: Home / Self Care | Payer: Worker's Compensation | Source: Ambulatory Visit | Attending: Occupational Medicine | Admitting: Occupational Medicine

## 2023-10-31 DIAGNOSIS — S46009A Unspecified injury of muscle(s) and tendon(s) of the rotator cuff of unspecified shoulder, initial encounter: Secondary | ICD-10-CM

## 2023-11-22 ENCOUNTER — Inpatient Hospital Stay
Admission: RE | Admit: 2023-11-22 | Discharge: 2023-11-22 | Disposition: A | Discharge status: Home / Self Care | Payer: Self-pay | Source: Ambulatory Visit

## 2023-11-22 ENCOUNTER — Ambulatory Visit
Admission: EM | Admit: 2023-11-22 | Discharge: 2023-11-22 | Disposition: A | Discharge status: Home / Self Care | Attending: Nurse Practitioner | Admitting: Nurse Practitioner

## 2023-11-22 ENCOUNTER — Ambulatory Visit (INDEPENDENT_AMBULATORY_CARE_PROVIDER_SITE_OTHER)

## 2023-11-22 DIAGNOSIS — S86911A Strain of unspecified muscle(s) and tendon(s) at lower leg level, right leg, initial encounter: Secondary | ICD-10-CM | POA: Diagnosis not present

## 2023-11-22 DIAGNOSIS — M25561 Pain in right knee: Secondary | ICD-10-CM
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
## 2023-11-22 NOTE — Discharge Instructions (Addendum)
 I suspect you have strained a ligament in your knee.  I will contact you later today if the x-ray shows anything abnormal.  In the meantime, recommend wearing the hinged knee brace whenever you are weightbearing.  You can apply ice to the area if it swells and a compression wrap if the area swells.  You can take Tylenol  or ibuprofen  as needed for pain.  Recommend close follow-up with an orthopedic provider-contact information has been provided-if symptoms do not improve with treatment.

## 2023-11-22 NOTE — ED Triage Notes (Signed)
 Pt reports his right knee gave out on him x 1 week ago. States this morning he could not put weight on his right knee this morning and almost fell getting out of bed.

## 2024-01-05 ENCOUNTER — Encounter (HOSPITAL_BASED_OUTPATIENT_CLINIC_OR_DEPARTMENT_OTHER): Payer: Self-pay

## 2024-01-05 ENCOUNTER — Emergency Department (HOSPITAL_BASED_OUTPATIENT_CLINIC_OR_DEPARTMENT_OTHER)
Admission: EM | Admit: 2024-01-05 | Discharge: 2024-01-06 | Disposition: A | Discharge status: Home / Self Care | Attending: Emergency Medicine | Admitting: Emergency Medicine

## 2024-01-05 DIAGNOSIS — H66002 Acute suppurative otitis media without spontaneous rupture of ear drum, left ear: Secondary | ICD-10-CM | POA: Diagnosis not present

## 2024-01-05 DIAGNOSIS — H9202 Otalgia, left ear: Secondary | ICD-10-CM | POA: Diagnosis present
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
## 2024-01-06 MED ORDER — AMOXICILLIN 500 MG PO CAPS: 500.0000 mg | ORAL_CAPSULE | Freq: Three times a day (TID) | ORAL | 0 refills | Status: AC

## 2024-01-06 MED ORDER — AMOXICILLIN 500 MG PO CAPS
500.0000 mg | ORAL_CAPSULE | Freq: Once | ORAL | Status: AC
  Administered 2024-01-06: 500 mg via ORAL
  Filled 2024-01-06: qty 1

## 2024-01-06 NOTE — Discharge Instructions (Signed)
 Begin taking amoxicillin as prescribed.  Take ibuprofen 600 mg every 6 hours as needed for pain.  Follow-up with primary doctor if not improving in the next week.

## 2024-01-06 NOTE — ED Provider Notes (Signed)
  [place] EMERGENCY DEPARTMENT AT [facility] Provider Note   CSN: [id number] Arrival date & time: 01/05/24  2040     History  No chief complaint on file.   James Wang is a 28 y.o. male.  Patient is a 28 year old male presenting with left ear pain.  This evening he sneezed, then laid down to sleep.  He woke up shortly afterward with discomfort in the left ear.  He denies any drainage.  He does report some muffled hearing.       Home Medications Prior to Admission medications   Not on File      Allergies    Patient has no known allergies.    Review of Systems   Review of Systems  All other systems reviewed and are negative.   Physical Exam Updated Vital Signs BP 130/83   Pulse (!) 59   Temp 97.9 F (36.6 C)   Resp 16   SpO2 100%  Physical Exam Vitals and nursing note reviewed.  Constitutional:      Appearance: Normal appearance.  HENT:     Right Ear: Tympanic membrane normal.     Ears:     Comments: The left TM is erythematous and swollen.  I am unable to visualize a perforation. Pulmonary:     Effort: Pulmonary effort is normal.  Neurological:     Mental Status: He is alert and oriented to person, place, and time.     ED Results / Procedures / Treatments   Labs (all labs ordered are listed, but only abnormal results are displayed) Labs Reviewed - No data to display  EKG None  Radiology No results found.  Procedures Procedures    Medications Ordered in ED Medications  amoxicillin  (AMOXIL ) capsule 500 mg (has no administration in time range)    ED Course/ Medical Decision Making/ A&P  This appears to be an otitis media.  Patient to be treated with amoxicillin  and as needed return.  I am unable to visualize a perforation or other more emergent pathology.  Final Clinical Impression(s) / ED Diagnoses Final diagnoses:  None    Rx / DC Orders ED Discharge Orders     None         Orvilla Blander, MD 01/06/24 [phone]

## 2024-01-06 NOTE — ED Notes (Signed)
 RN reviewed discharge instructions with pt. Pt verbalized understanding and had no further questions. VSS upon discharge.
